# Patient Record
Sex: Male | Born: 1975 | Race: Black or African American | Hispanic: No | Marital: Single
Health system: Southern US, Community
[De-identification: ages and names within clinical notes are randomized; demographics above are authoritative.]

---

## 2005-08-29 ENCOUNTER — Inpatient Hospital Stay (HOSPITAL_COMMUNITY): Admission: EM | Admit: 2005-08-29 | Discharge: 2005-09-25 | Payer: Self-pay | Admitting: Emergency Medicine

## 2005-08-29 ENCOUNTER — Ambulatory Visit: Payer: Self-pay | Admitting: Pulmonary Disease

## 2005-09-22 ENCOUNTER — Ambulatory Visit: Payer: Self-pay | Admitting: Physical Medicine & Rehabilitation

## 2005-09-30 ENCOUNTER — Inpatient Hospital Stay (HOSPITAL_COMMUNITY): Admission: RE | Admit: 2005-09-30 | Discharge: 2005-10-07 | Payer: Self-pay | Admitting: Neurosurgery

## 2008-04-17 IMAGING — CT CT HEAD W/O CM
1 of 2 series · 16 of 30 positions shown, 20 images · non-contrast
Comparison: 09/17/2005

CLINICAL DATA: Subarachnoid hemorrhage

HEAD CT WITHOUT CONTRAST
TECHNIQUE: 5mm collimated images were obtained from the base of the skull
through the vertex according to standard protocol without contrast.

[Series 2: headseq 4.8 h45s · axial · 0.50mm/px · z∈[-187,-47]mm · 16 of 33 slices shown, 20 images]
[im 2/33  brain]
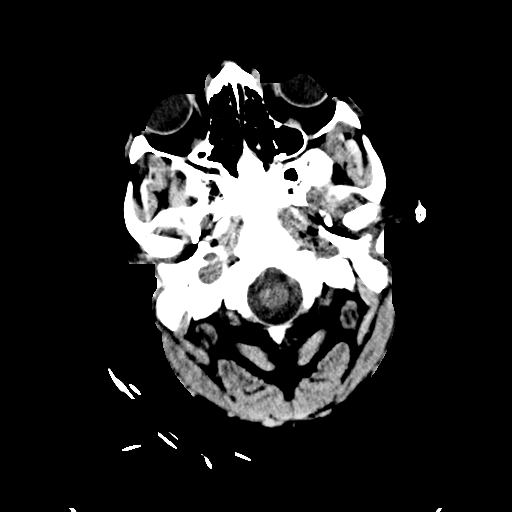
[im 2/33  bone]
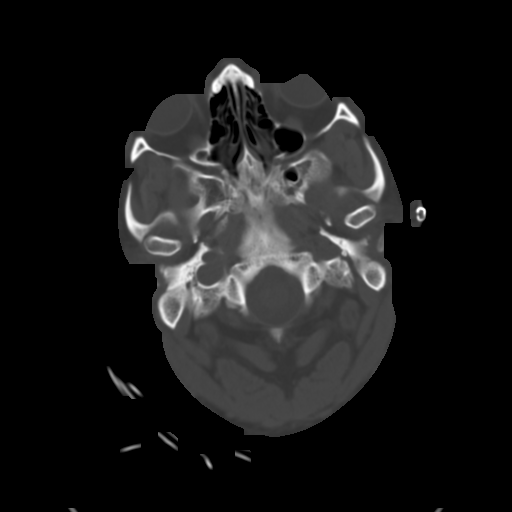
[im 5/33  brain]
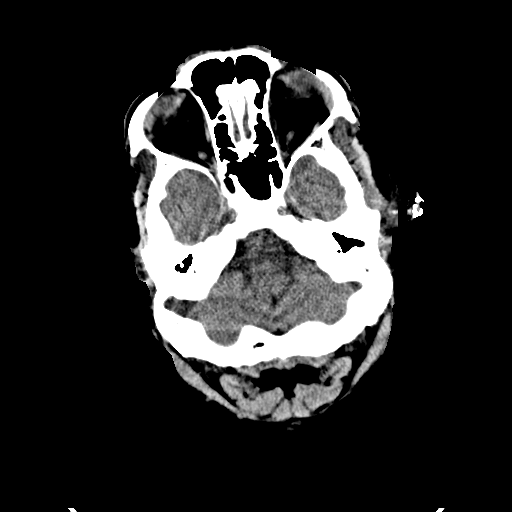
[im 6/33  brain]
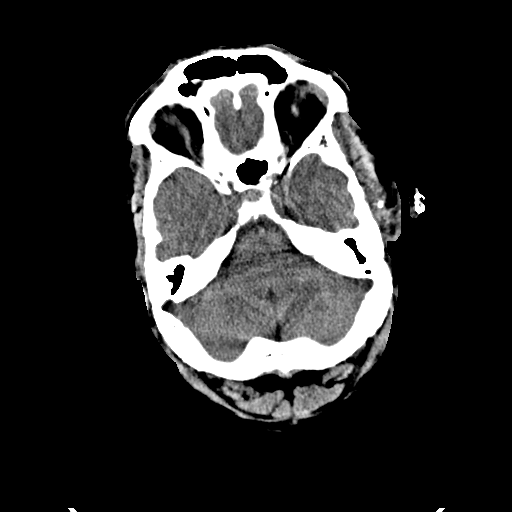
[im 9/33  brain]
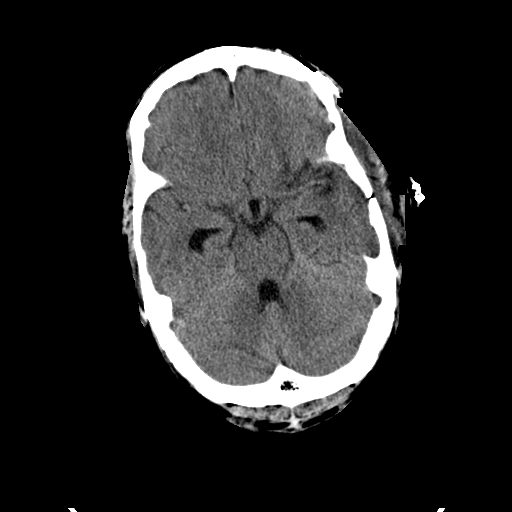
[im 10/33  brain]
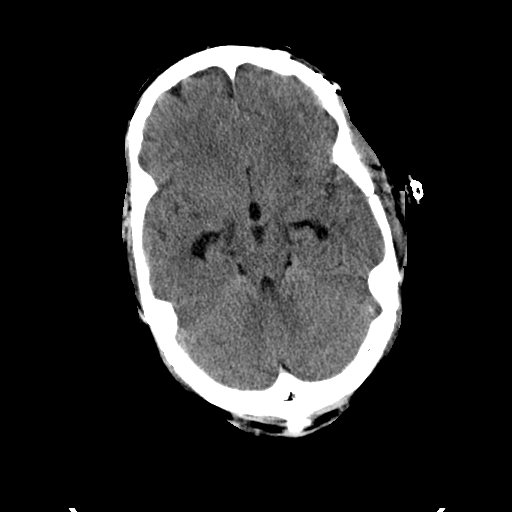
[im 10/33  bone]
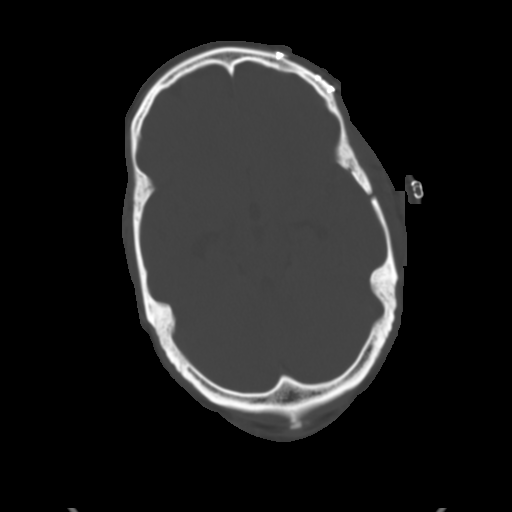
[im 11/33  brain]
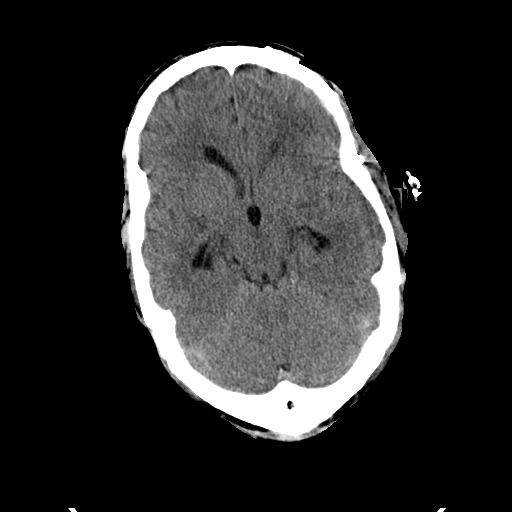
[im 14/33  brain]
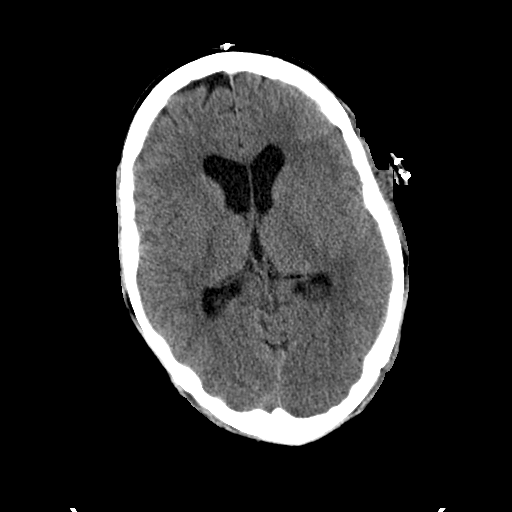
[im 15/33  brain]
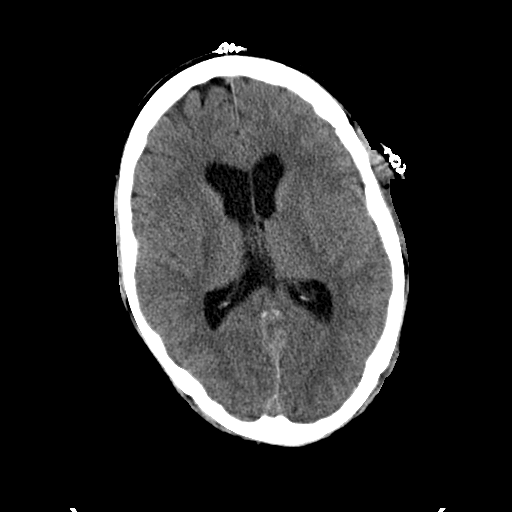
[im 18/33  brain]
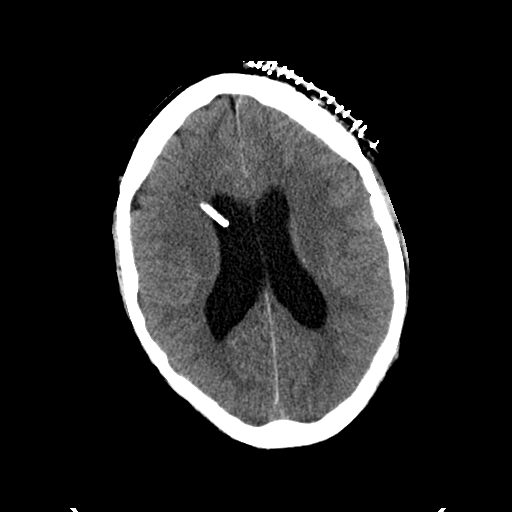
[im 18/33  bone]
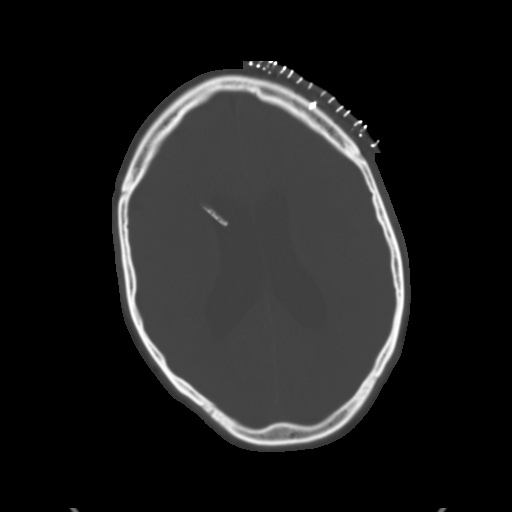
[im 19/33  brain]
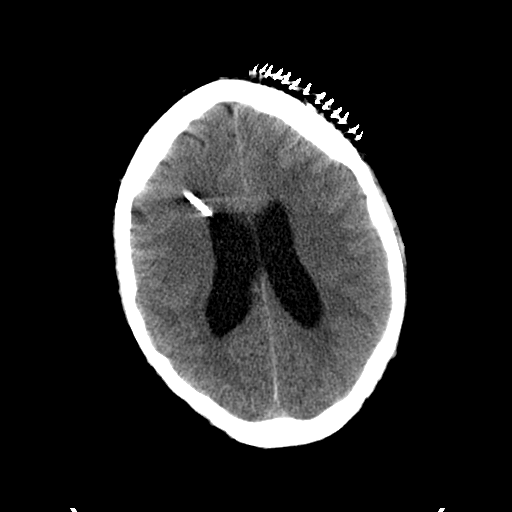
[im 22/33  brain]
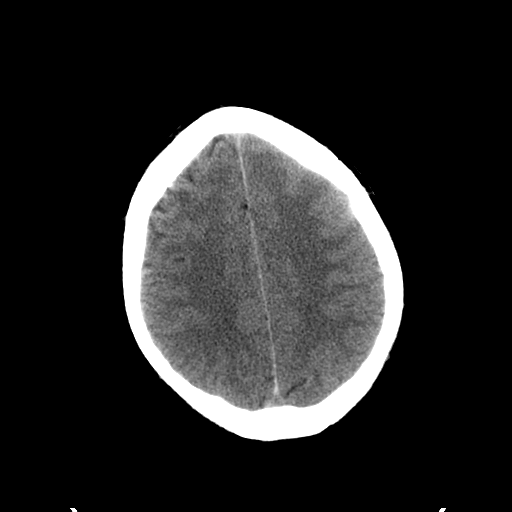
[im 23/33  brain]
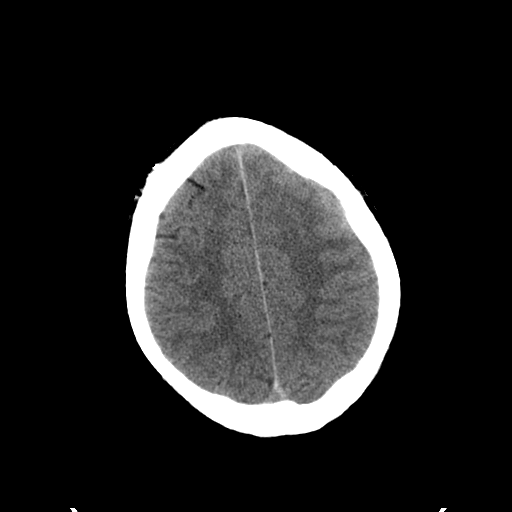
[im 25/33  brain]
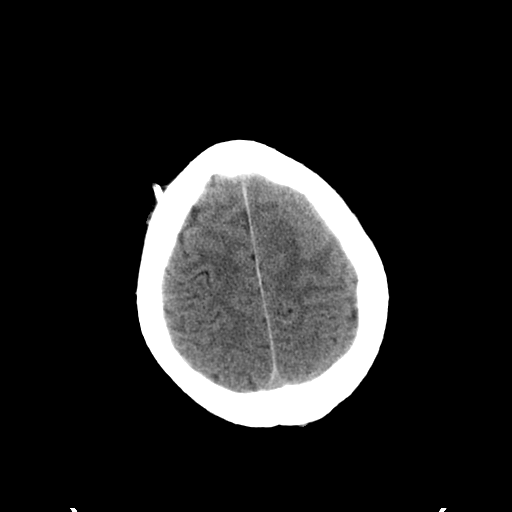
[im 25/33  bone]
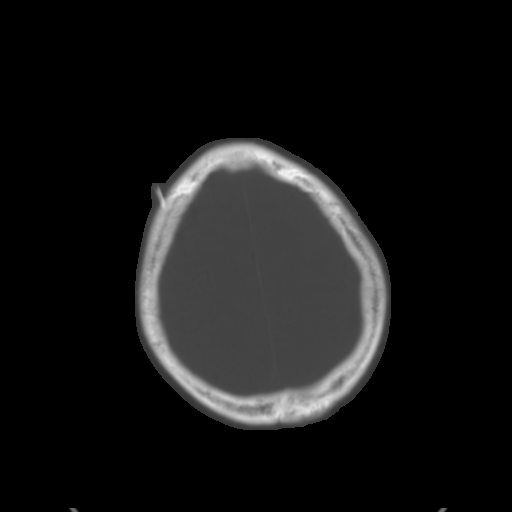
[im 27/33  brain]
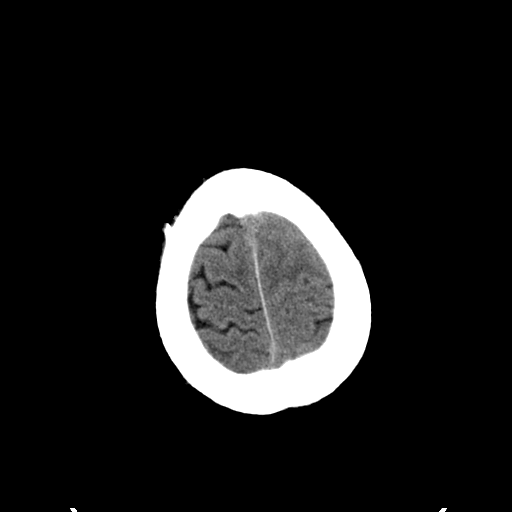
[im 29/33  brain]
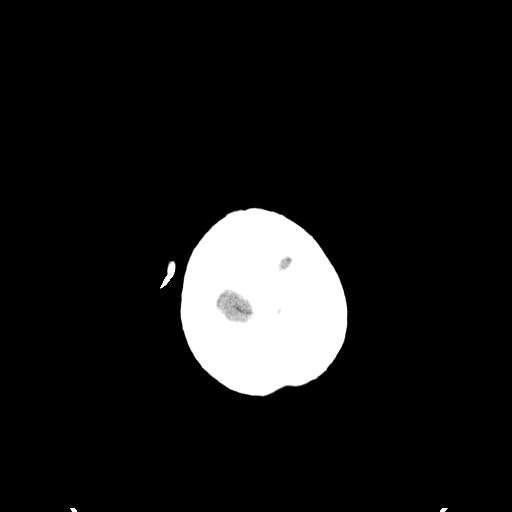
[im 31/33  brain]
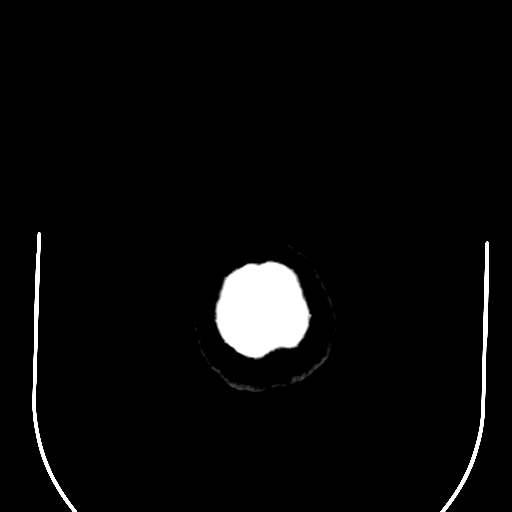

[16 of 30 positions shown; findings below may reference images not displayed]

FINDINGS: Right frontal ventriculostomy catheter is in place with its distal
tip in the right lateral ventricle. There is stable mild dilatation of the
temporal horns of the lateral ventricles. They're stable mild effacement of the
sulci and the left cerebrum, but no new hemorrhage and no midline shift.

IMPRESSION

Stable mild ventriculomegaly. No new hemorrhage. Right frontal ventriculostomy
catheter remains in place.

## 2017-12-03 DIAGNOSIS — G609 Hereditary and idiopathic neuropathy, unspecified: Secondary | ICD-10-CM | POA: Diagnosis not present

## 2017-12-03 DIAGNOSIS — N50812 Left testicular pain: Secondary | ICD-10-CM | POA: Diagnosis not present

## 2017-12-03 DIAGNOSIS — L210 Seborrhea capitis: Secondary | ICD-10-CM | POA: Diagnosis not present

## 2017-12-03 DIAGNOSIS — I10 Essential (primary) hypertension: Secondary | ICD-10-CM | POA: Diagnosis not present

## 2017-12-21 DIAGNOSIS — L219 Seborrheic dermatitis, unspecified: Secondary | ICD-10-CM | POA: Diagnosis not present

## 2017-12-21 DIAGNOSIS — L300 Nummular dermatitis: Secondary | ICD-10-CM | POA: Diagnosis not present

## 2017-12-24 DIAGNOSIS — H401132 Primary open-angle glaucoma, bilateral, moderate stage: Secondary | ICD-10-CM | POA: Diagnosis not present

## 2018-01-04 ENCOUNTER — Ambulatory Visit: Payer: Self-pay | Admitting: Neurology

## 2018-01-18 ENCOUNTER — Encounter: Payer: Self-pay | Admitting: Neurology

## 2018-01-25 ENCOUNTER — Ambulatory Visit: Payer: Self-pay | Admitting: Neurology

## 2018-02-03 DIAGNOSIS — L210 Seborrhea capitis: Secondary | ICD-10-CM | POA: Diagnosis not present

## 2018-02-03 DIAGNOSIS — I10 Essential (primary) hypertension: Secondary | ICD-10-CM | POA: Diagnosis not present

## 2018-02-03 DIAGNOSIS — N50812 Left testicular pain: Secondary | ICD-10-CM | POA: Diagnosis not present

## 2018-02-03 DIAGNOSIS — M659 Synovitis and tenosynovitis, unspecified: Secondary | ICD-10-CM | POA: Diagnosis not present

## 2018-03-01 ENCOUNTER — Telehealth: Payer: Self-pay | Admitting: Neurology

## 2018-03-01 ENCOUNTER — Ambulatory Visit: Payer: Self-pay | Admitting: Neurology

## 2018-03-01 NOTE — Telephone Encounter (Signed)
This patient did not show for a new patient appointment today. 

## 2018-03-01 NOTE — Telephone Encounter (Signed)
Error

## 2018-03-02 ENCOUNTER — Encounter: Payer: Self-pay | Admitting: Neurology

## 2018-04-26 DIAGNOSIS — R0789 Other chest pain: Secondary | ICD-10-CM | POA: Diagnosis not present

## 2018-04-26 DIAGNOSIS — R079 Chest pain, unspecified: Secondary | ICD-10-CM | POA: Diagnosis not present

## 2018-04-26 DIAGNOSIS — I10 Essential (primary) hypertension: Secondary | ICD-10-CM | POA: Diagnosis not present

## 2018-04-26 DIAGNOSIS — R0602 Shortness of breath: Secondary | ICD-10-CM | POA: Diagnosis not present

## 2018-04-26 DIAGNOSIS — Z8249 Family history of ischemic heart disease and other diseases of the circulatory system: Secondary | ICD-10-CM | POA: Diagnosis not present

## 2018-04-26 DIAGNOSIS — Z79899 Other long term (current) drug therapy: Secondary | ICD-10-CM | POA: Diagnosis not present

## 2018-04-26 DIAGNOSIS — K219 Gastro-esophageal reflux disease without esophagitis: Secondary | ICD-10-CM | POA: Diagnosis not present

## 2018-04-26 DIAGNOSIS — R001 Bradycardia, unspecified: Secondary | ICD-10-CM | POA: Diagnosis not present

## 2018-04-27 DIAGNOSIS — I10 Essential (primary) hypertension: Secondary | ICD-10-CM | POA: Diagnosis not present

## 2018-04-27 DIAGNOSIS — R0789 Other chest pain: Secondary | ICD-10-CM | POA: Diagnosis not present

## 2018-04-27 DIAGNOSIS — K219 Gastro-esophageal reflux disease without esophagitis: Secondary | ICD-10-CM | POA: Diagnosis not present

## 2018-04-27 DIAGNOSIS — Z8249 Family history of ischemic heart disease and other diseases of the circulatory system: Secondary | ICD-10-CM | POA: Diagnosis not present

## 2018-04-29 DIAGNOSIS — L210 Seborrhea capitis: Secondary | ICD-10-CM | POA: Diagnosis not present

## 2018-04-29 DIAGNOSIS — I10 Essential (primary) hypertension: Secondary | ICD-10-CM | POA: Diagnosis not present

## 2018-04-29 DIAGNOSIS — Z79899 Other long term (current) drug therapy: Secondary | ICD-10-CM | POA: Diagnosis not present

## 2018-04-29 DIAGNOSIS — G609 Hereditary and idiopathic neuropathy, unspecified: Secondary | ICD-10-CM | POA: Diagnosis not present

## 2018-04-29 DIAGNOSIS — N50812 Left testicular pain: Secondary | ICD-10-CM | POA: Diagnosis not present

## 2018-06-08 DIAGNOSIS — L299 Pruritus, unspecified: Secondary | ICD-10-CM | POA: Diagnosis not present

## 2018-06-08 DIAGNOSIS — L400 Psoriasis vulgaris: Secondary | ICD-10-CM | POA: Diagnosis not present

## 2018-06-22 DIAGNOSIS — L210 Seborrhea capitis: Secondary | ICD-10-CM | POA: Diagnosis not present

## 2018-06-22 DIAGNOSIS — G609 Hereditary and idiopathic neuropathy, unspecified: Secondary | ICD-10-CM | POA: Diagnosis not present

## 2018-06-22 DIAGNOSIS — I10 Essential (primary) hypertension: Secondary | ICD-10-CM | POA: Diagnosis not present

## 2018-06-22 DIAGNOSIS — N50812 Left testicular pain: Secondary | ICD-10-CM | POA: Diagnosis not present

## 2018-07-13 DIAGNOSIS — L400 Psoriasis vulgaris: Secondary | ICD-10-CM | POA: Diagnosis not present

## 2018-09-07 DIAGNOSIS — L400 Psoriasis vulgaris: Secondary | ICD-10-CM | POA: Diagnosis not present

## 2018-09-07 DIAGNOSIS — R531 Weakness: Secondary | ICD-10-CM | POA: Diagnosis not present

## 2018-09-22 DIAGNOSIS — G609 Hereditary and idiopathic neuropathy, unspecified: Secondary | ICD-10-CM | POA: Diagnosis not present

## 2018-09-22 DIAGNOSIS — N50812 Left testicular pain: Secondary | ICD-10-CM | POA: Diagnosis not present

## 2018-09-22 DIAGNOSIS — I10 Essential (primary) hypertension: Secondary | ICD-10-CM | POA: Diagnosis not present

## 2018-09-22 DIAGNOSIS — L210 Seborrhea capitis: Secondary | ICD-10-CM | POA: Diagnosis not present

## 2018-10-11 DIAGNOSIS — I10 Essential (primary) hypertension: Secondary | ICD-10-CM | POA: Diagnosis not present

## 2018-10-11 DIAGNOSIS — N50812 Left testicular pain: Secondary | ICD-10-CM | POA: Diagnosis not present

## 2018-10-11 DIAGNOSIS — L210 Seborrhea capitis: Secondary | ICD-10-CM | POA: Diagnosis not present

## 2018-10-11 DIAGNOSIS — G609 Hereditary and idiopathic neuropathy, unspecified: Secondary | ICD-10-CM | POA: Diagnosis not present

## 2018-10-22 DIAGNOSIS — L400 Psoriasis vulgaris: Secondary | ICD-10-CM | POA: Diagnosis not present

## 2018-11-15 DIAGNOSIS — L400 Psoriasis vulgaris: Secondary | ICD-10-CM | POA: Diagnosis not present

## 2018-11-15 DIAGNOSIS — R600 Localized edema: Secondary | ICD-10-CM | POA: Diagnosis not present

## 2018-11-17 DIAGNOSIS — L210 Seborrhea capitis: Secondary | ICD-10-CM | POA: Diagnosis not present

## 2018-11-17 DIAGNOSIS — I10 Essential (primary) hypertension: Secondary | ICD-10-CM | POA: Diagnosis not present

## 2018-11-17 DIAGNOSIS — N50812 Left testicular pain: Secondary | ICD-10-CM | POA: Diagnosis not present

## 2018-11-17 DIAGNOSIS — R600 Localized edema: Secondary | ICD-10-CM | POA: Diagnosis not present

## 2018-11-22 DIAGNOSIS — Z1331 Encounter for screening for depression: Secondary | ICD-10-CM | POA: Diagnosis not present

## 2018-11-22 DIAGNOSIS — N50812 Left testicular pain: Secondary | ICD-10-CM | POA: Diagnosis not present

## 2018-11-22 DIAGNOSIS — L210 Seborrhea capitis: Secondary | ICD-10-CM | POA: Diagnosis not present

## 2018-11-22 DIAGNOSIS — I10 Essential (primary) hypertension: Secondary | ICD-10-CM | POA: Diagnosis not present

## 2018-11-22 DIAGNOSIS — R600 Localized edema: Secondary | ICD-10-CM | POA: Diagnosis not present

## 2018-12-06 DIAGNOSIS — I10 Essential (primary) hypertension: Secondary | ICD-10-CM | POA: Diagnosis not present

## 2018-12-06 DIAGNOSIS — N50812 Left testicular pain: Secondary | ICD-10-CM | POA: Diagnosis not present

## 2018-12-06 DIAGNOSIS — L210 Seborrhea capitis: Secondary | ICD-10-CM | POA: Diagnosis not present

## 2018-12-06 DIAGNOSIS — R600 Localized edema: Secondary | ICD-10-CM | POA: Diagnosis not present

## 2019-06-16 DIAGNOSIS — G609 Hereditary and idiopathic neuropathy, unspecified: Secondary | ICD-10-CM | POA: Diagnosis not present

## 2019-06-16 DIAGNOSIS — I10 Essential (primary) hypertension: Secondary | ICD-10-CM | POA: Diagnosis not present

## 2019-06-16 DIAGNOSIS — J309 Allergic rhinitis, unspecified: Secondary | ICD-10-CM | POA: Diagnosis not present

## 2019-06-16 DIAGNOSIS — N50812 Left testicular pain: Secondary | ICD-10-CM | POA: Diagnosis not present

## 2019-09-26 DIAGNOSIS — H0011 Chalazion right upper eyelid: Secondary | ICD-10-CM | POA: Diagnosis not present

## 2019-09-26 DIAGNOSIS — R220 Localized swelling, mass and lump, head: Secondary | ICD-10-CM | POA: Diagnosis not present

## 2019-09-26 DIAGNOSIS — I10 Essential (primary) hypertension: Secondary | ICD-10-CM | POA: Diagnosis not present
